# Patient Record
Sex: Male | Born: 2000 | Race: White | Hispanic: No | Marital: Single | Smoking: Never smoker
Health system: Southern US, Community
[De-identification: ages and names within clinical notes are randomized; demographics above are authoritative.]

## PROBLEM LIST (undated history)

## (undated) DIAGNOSIS — U071 COVID-19: Secondary | ICD-10-CM

## (undated) HISTORY — PX: COSMETIC SURGERY: SHX468

---
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
## 2023-08-28 ENCOUNTER — Encounter (HOSPITAL_BASED_OUTPATIENT_CLINIC_OR_DEPARTMENT_OTHER): Payer: Self-pay

## 2023-08-28 ENCOUNTER — Ambulatory Visit (HOSPITAL_BASED_OUTPATIENT_CLINIC_OR_DEPARTMENT_OTHER): Payer: Medicaid Other | Admitting: Radiology

## 2023-08-28 ENCOUNTER — Ambulatory Visit (HOSPITAL_BASED_OUTPATIENT_CLINIC_OR_DEPARTMENT_OTHER)
Admission: EM | Admit: 2023-08-28 | Discharge: 2023-08-28 | Disposition: A | Discharge status: Home / Self Care | Payer: Medicaid Other

## 2023-08-28 ENCOUNTER — Ambulatory Visit (HOSPITAL_BASED_OUTPATIENT_CLINIC_OR_DEPARTMENT_OTHER): Payer: Self-pay

## 2023-08-28 DIAGNOSIS — R051 Acute cough: Secondary | ICD-10-CM

## 2023-08-28 DIAGNOSIS — J101 Influenza due to other identified influenza virus with other respiratory manifestations: Secondary | ICD-10-CM

## 2023-08-28 DIAGNOSIS — J208 Acute bronchitis due to other specified organisms: Secondary | ICD-10-CM

## 2023-08-28 DIAGNOSIS — R0602 Shortness of breath: Secondary | ICD-10-CM

## 2023-08-28 DIAGNOSIS — R059 Cough, unspecified: Secondary | ICD-10-CM | POA: Diagnosis not present

## 2023-08-28 MED ORDER — PROMETHAZINE-DM 6.25-15 MG/5ML PO SYRP: 5.0000 mL | ORAL_SOLUTION | Freq: Four times a day (QID) | ORAL | 0 refills | Status: AC | PRN

## 2023-08-28 MED ORDER — PREDNISONE 20 MG PO TABS: 40.0000 mg | ORAL_TABLET | Freq: Every day | ORAL | 0 refills | Status: AC

## 2023-08-28 MED ORDER — ALBUTEROL SULFATE HFA 108 (90 BASE) MCG/ACT IN AERS: 1.0000 | INHALATION_SPRAY | Freq: Four times a day (QID) | RESPIRATORY_TRACT | 0 refills | Status: AC | PRN

## 2023-08-28 NOTE — Discharge Instructions (Addendum)
 I did not see anything on your x-ray concerning for pneumonia.  If the radiologist sees something I will call you.  Use albuterol every 4-6 hours as needed for shortness of breath and coughing fits.  Start prednisone 40 mg for 5 days.  Do not take NSAIDs with this medication including aspirin, ibuprofen/Advil, naproxen/Aleve.  Use Promethazine DM for cough.  This will make you sleepy so do not drive drink alcohol taking it.  Finish the Tamiflu that you are previously prescribed.  If anything worsens and you have high fever, worsening cough, shortness of breath despite these medications, chest pain, nausea/vomiting interfering with oral intake you need to be seen immediately.  If you are not better in 3 to 5 days please return for reevaluation.

## 2023-08-28 NOTE — ED Provider Notes (Signed)
 [name]    CSN: [id number] Arrival date & time: 08/28/23  1113      History   Chief Complaint Chief Complaint  Patient presents with   Cough   Shortness of Breath    HPI Marc Carter is a 23 y.o. male.   Patient presents today companied by his mother who provide the majority of history.  Reports that approximately 5 days ago he developed URI symptoms including fever, body aches, cough, congestion.  He was seen at a different urgent care and tested positive for influenza.  He has started Tamiflu and been taking over-the-counter medications but has had worsening cough.  He reports the other symptoms including body aches, fever, significant congestion have resolved but his cough has worsened and he is now experiencing shortness of breath and chest tightness.  He does have an albuterol inhaler that he has been using with temporary improvement of symptoms.  Denies formal diagnosis of asthma or COPD.  He does not smoke.  Denies any recent steroids in the past 90 days.  He did take doxycycline for sinus infection within the past 90 days but denies additional antibiotic use.    Past Medical History:  Diagnosis Date   COVID    Pneumonia 01/2020    There are no active problems to display for this patient.   Past Surgical History:  Procedure Laterality Date   COSMETIC SURGERY     surgery on face from dog bite at age 89       Home Medications    Prior to Admission medications   Medication Sig Start Date End Date Taking? Authorizing Provider  albuterol (VENTOLIN HFA) 108 (90 Base) MCG/ACT inhaler Inhale 1-2 puffs into the lungs every 6 (six) hours as needed for wheezing or shortness of breath. 08/28/23  Yes Marge Vandermeulen K, PA-C  oseltamivir (TAMIFLU) 75 MG capsule Take 75 mg by mouth 2 (two) times daily.   Yes [provider]  predniSONE (DELTASONE) 20 MG tablet Take 2 tablets (40 mg total) by mouth daily for 5 days. 08/28/23 09/02/23 Yes Prestin Munch K, PA-C   promethazine-dextromethorphan (PROMETHAZINE-DM) 6.25-15 MG/5ML syrup Take 5 mLs by mouth 4 (four) times daily as needed for cough. 08/28/23  Yes Viyaan Champine, Noberto Retort, PA-C    Family History Family History  Problem Relation Age of Onset   Healthy Mother    Healthy Father     Social History Social History   Tobacco Use   Smoking status: Never   Smokeless tobacco: Never  Vaping Use   Vaping status: Never Used  Substance Use Topics   Alcohol use: Never   Drug use: Never     Allergies   Diphenhydramine   Review of Systems Review of Systems  Constitutional:  Positive for activity change. Negative for appetite change, fatigue and fever (Resolved).  HENT:  Positive for congestion. Negative for sinus pressure, sneezing and sore throat.   Respiratory:  Positive for cough, chest tightness and shortness of breath. Negative for wheezing.   Cardiovascular:  Negative for chest pain.  Gastrointestinal:  Negative for abdominal pain, diarrhea, nausea and vomiting.  Musculoskeletal:  Negative for arthralgias (Improved) and myalgias (Improved).  Neurological:  Negative for dizziness, light-headedness and headaches.     Physical Exam Triage Vital Signs ED Triage Vitals [08/28/23 1257]  Encounter Vitals Group     BP 113/81     Systolic BP Percentile      Diastolic BP Percentile      Pulse Rate  66     Resp 17     Temp 98.6 F (37 C)     Temp src      SpO2 96 %     Weight      Height      Head Circumference      Peak Flow      Pain Score 0     Pain Loc      Pain Education      Exclude from Growth Chart    No data found.  Updated Vital Signs BP 113/81 (BP Location: Right Arm)   Pulse 72   Temp 98.6 F (37 C)   Resp 17   SpO2 98%   Visual Acuity Right Eye Distance:   Left Eye Distance:   Bilateral Distance:    Right Eye Near:   Left Eye Near:    Bilateral Near:     Physical Exam Vitals reviewed.  Constitutional:      General: He is awake.     Appearance: Normal  appearance. He is well-developed. He is not ill-appearing.     Comments: Very pleasant male appears stated age in no acute distress sitting comfortably in exam room  HENT:     Head: Normocephalic and atraumatic.     Right Ear: Tympanic membrane, ear canal and external ear normal. Tympanic membrane is not erythematous or bulging.     Left Ear: Tympanic membrane, ear canal and external ear normal. Tympanic membrane is not erythematous or bulging.     Nose: Nose normal.     Mouth/Throat:     Pharynx: Uvula midline. No oropharyngeal exudate, posterior oropharyngeal erythema or uvula swelling.  Cardiovascular:     Rate and Rhythm: Normal rate and regular rhythm.     Heart sounds: Normal heart sounds, S1 normal and S2 normal. No murmur heard. Pulmonary:     Effort: Pulmonary effort is normal. No accessory muscle usage or respiratory distress.     Breath sounds: Normal breath sounds. No stridor. No wheezing, rhonchi or rales.     Comments: Clear to auscultation bilaterally Neurological:     Mental Status: He is alert.  Psychiatric:        Behavior: Behavior is cooperative.      UC Treatments / Results  Labs (all labs ordered are listed, but only abnormal results are displayed) Labs Reviewed - No data to display  EKG   Radiology No results found.  Procedures Procedures (including critical care time)  Medications Ordered in UC Medications - No data to display  Initial Impression / Assessment and Plan / UC Course  I have reviewed the triage vital signs and the nursing notes.  Pertinent labs & imaging results that were available during my care of the patient were reviewed by me and considered in my medical decision making (see chart for details).     Patient is well-appearing, afebrile, nontoxic, nontachycardic.  We discussed that his symptoms are likely related to influenza A that he is recovering from.  He is to complete course of Tamiflu as prescribed by the other urgent care.   He and his mother were concerned for pneumonia and so chest x-ray was obtained that showed no acute cardiopulmonary disease based on my primary read.  At the time of discharge we were waiting for radiologist over read and we will contact them if this differs and changes our treatment plan.  Suspect bronchitis related to influenza as etiology of symptoms.  He was started on prednisone  with instructions to take NSAIDs.  He was given Promethazine DM for cough and we discussed that he should not drive or drink alcohol with taking it.  He can use over-the-counter medication including Mucinex, Tylenol, nasal saline/sinus rinses, Flonase.  He was given albuterol every 4-6 hours as needed for shortness of breath and coughing fits.  We discussed that if anything worsens or changes and he has worsening cough, high fever, chest pain, shortness of breath, nausea/vomiting interfering with oral intake he needs to be seen immediately.  Strict return precautions given.  Excuse note provided.  Final Clinical Impressions(s) / UC Diagnoses   Final diagnoses:  Acute cough  Viral bronchitis  Shortness of breath  Influenza A     Discharge Instructions      I did not see anything on your x-ray concerning for pneumonia.  If the radiologist sees something I will call you.  Use albuterol every 4-6 hours as needed for shortness of breath and coughing fits.  Start prednisone 40 mg for 5 days.  Do not take NSAIDs with this medication including aspirin, ibuprofen/Advil, naproxen/Aleve.  Use Promethazine DM for cough.  This will make you sleepy so do not drive drink alcohol taking it.  Finish the Tamiflu that you are previously prescribed.  If anything worsens and you have high fever, worsening cough, shortness of breath despite these medications, chest pain, nausea/vomiting interfering with oral intake you need to be seen immediately.  If you are not better in 3 to 5 days please return for reevaluation.     ED Prescriptions      Medication Sig Dispense Auth. Provider   predniSONE (DELTASONE) 20 MG tablet Take 2 tablets (40 mg total) by mouth daily for 5 days. 10 tablet Enrico Eaddy K, PA-C   promethazine-dextromethorphan (PROMETHAZINE-DM) 6.25-15 MG/5ML syrup Take 5 mLs by mouth 4 (four) times daily as needed for cough. 118 mL Shalynn Jorstad K, PA-C   albuterol (VENTOLIN HFA) 108 (90 Base) MCG/ACT inhaler Inhale 1-2 puffs into the lungs every 6 (six) hours as needed for wheezing or shortness of breath. 18 g Ceclia Koker K, PA-C      PDMP not reviewed this encounter.   Jeani Hawking, PA-C 08/28/23 1337

## 2023-08-28 NOTE — ED Triage Notes (Signed)
 Patient tested positive for Flu A Sunday   He's having a non productive cough that makes his feel ike he has pressure in his chest. SOB when he walks.
